# Patient Record
Sex: Female | Born: 1980 | Race: White | Hispanic: No | Marital: Married | Smoking: Former smoker
Health system: Southern US, Community
[De-identification: ages and names within clinical notes are randomized; demographics above are authoritative.]

## PROBLEM LIST (undated history)

## (undated) ENCOUNTER — Inpatient Hospital Stay: Admission: EM | Payer: Self-pay | Source: Home / Self Care

## (undated) DIAGNOSIS — G4700 Insomnia, unspecified: Secondary | ICD-10-CM

## (undated) DIAGNOSIS — F419 Anxiety disorder, unspecified: Secondary | ICD-10-CM

## (undated) DIAGNOSIS — R5383 Other fatigue: Secondary | ICD-10-CM

## (undated) DIAGNOSIS — R918 Other nonspecific abnormal finding of lung field: Secondary | ICD-10-CM

## (undated) DIAGNOSIS — B001 Herpesviral vesicular dermatitis: Secondary | ICD-10-CM

## (undated) DIAGNOSIS — E039 Hypothyroidism, unspecified: Secondary | ICD-10-CM

## (undated) DIAGNOSIS — F32A Depression, unspecified: Secondary | ICD-10-CM

## (undated) HISTORY — DX: Herpesviral vesicular dermatitis: B00.1

## (undated) HISTORY — DX: Anxiety disorder, unspecified: F41.9

## (undated) HISTORY — DX: Depression, unspecified: F32.A

## (undated) HISTORY — DX: Hypothyroidism, unspecified: E03.9

## (undated) HISTORY — PX: OTHER SURGICAL HISTORY: SHX169

## (undated) HISTORY — PX: CYSTECTOMY: SUR359

## (undated) HISTORY — DX: Other fatigue: R53.83

## (undated) HISTORY — PX: TUBAL LIGATION: SHX77

## (undated) HISTORY — PX: THYROIDECTOMY: SHX17

## (undated) HISTORY — DX: Other nonspecific abnormal finding of lung field: R91.8

## (undated) HISTORY — DX: Insomnia, unspecified: G47.00

## (undated) HISTORY — PX: TONSILLECTOMY: SUR1361

## (undated) HISTORY — PX: CHOLECYSTECTOMY: SHX55

---

## 2009-12-22 ENCOUNTER — Ambulatory Visit (HOSPITAL_COMMUNITY)
Admission: RE | Admit: 2009-12-22 | Discharge: 2009-12-22 | Payer: Self-pay | Source: Home / Self Care | Attending: Interventional Radiology | Admitting: Interventional Radiology

## 2010-03-20 LAB — POCT I-STAT, CHEM 8
BUN: 9 mg/dL (ref 6–23)
Chloride: 108 mEq/L (ref 96–112)
Creatinine, Ser: 0.7 mg/dL (ref 0.4–1.2)
Glucose, Bld: 94 mg/dL (ref 70–99)
Potassium: 3.9 mEq/L (ref 3.5–5.1)

## 2010-03-20 LAB — CBC
RDW: 13.8 % (ref 11.5–15.5)
WBC: 6.5 10*3/uL (ref 4.0–10.5)

## 2010-03-20 LAB — APTT: aPTT: 30 seconds (ref 24–37)

## 2010-03-20 LAB — PROTIME-INR: INR: 0.93 (ref 0.00–1.49)

## 2015-06-03 DIAGNOSIS — E039 Hypothyroidism, unspecified: Secondary | ICD-10-CM

## 2015-06-03 HISTORY — DX: Hypothyroidism, unspecified: E03.9

## 2015-07-21 DIAGNOSIS — E559 Vitamin D deficiency, unspecified: Secondary | ICD-10-CM

## 2015-07-21 DIAGNOSIS — C73 Malignant neoplasm of thyroid gland: Secondary | ICD-10-CM | POA: Insufficient documentation

## 2015-07-21 HISTORY — DX: Vitamin D deficiency, unspecified: E55.9

## 2015-07-21 HISTORY — DX: Malignant neoplasm of thyroid gland: C73

## 2017-05-22 DIAGNOSIS — N912 Amenorrhea, unspecified: Secondary | ICD-10-CM

## 2017-05-22 HISTORY — DX: Amenorrhea, unspecified: N91.2

## 2019-12-21 DIAGNOSIS — N83201 Unspecified ovarian cyst, right side: Secondary | ICD-10-CM

## 2019-12-21 DIAGNOSIS — R918 Other nonspecific abnormal finding of lung field: Secondary | ICD-10-CM

## 2019-12-21 HISTORY — DX: Unspecified ovarian cyst, right side: N83.201

## 2020-06-28 DIAGNOSIS — I2510 Atherosclerotic heart disease of native coronary artery without angina pectoris: Secondary | ICD-10-CM

## 2020-07-18 ENCOUNTER — Encounter: Payer: Self-pay | Admitting: *Deleted

## 2020-07-18 ENCOUNTER — Encounter: Payer: Self-pay | Admitting: Cardiology

## 2020-07-19 ENCOUNTER — Other Ambulatory Visit: Payer: Self-pay

## 2020-07-19 ENCOUNTER — Ambulatory Visit: Payer: Medicaid Other | Admitting: Cardiology

## 2020-07-19 ENCOUNTER — Encounter: Payer: Self-pay | Admitting: Cardiology

## 2020-07-19 VITALS — BP 100/70 | HR 94 | Ht 65.0 in | Wt 140.0 lb

## 2020-07-19 DIAGNOSIS — Z87891 Personal history of nicotine dependence: Secondary | ICD-10-CM

## 2020-07-19 DIAGNOSIS — I2510 Atherosclerotic heart disease of native coronary artery without angina pectoris: Secondary | ICD-10-CM | POA: Diagnosis not present

## 2020-07-19 DIAGNOSIS — E785 Hyperlipidemia, unspecified: Secondary | ICD-10-CM | POA: Diagnosis not present

## 2020-07-19 DIAGNOSIS — R079 Chest pain, unspecified: Secondary | ICD-10-CM | POA: Diagnosis not present

## 2020-07-19 MED ORDER — ROSUVASTATIN CALCIUM 10 MG PO TABS: 10.0000 mg | ORAL_TABLET | Freq: Every day | ORAL | 3 refills | Status: DC

## 2020-07-19 MED ORDER — NITROGLYCERIN 0.4 MG SL SUBL: 0.4000 mg | SUBLINGUAL_TABLET | SUBLINGUAL | 3 refills | Status: AC | PRN

## 2020-07-19 MED ORDER — IVABRADINE HCL 5 MG PO TABS: ORAL_TABLET | ORAL | 0 refills | Status: DC

## 2020-07-19 MED ORDER — ASPIRIN EC 81 MG PO TBEC: 81.0000 mg | DELAYED_RELEASE_TABLET | Freq: Every day | ORAL | 3 refills | Status: AC

## 2020-07-19 NOTE — Patient Instructions (Signed)
Medication Instructions:  Your physician has recommended you make the following change in your medication:  START: Aspirin 81 mg once daily  START: Crestor 10 mg once daily START: Nitroglycerin 0.4 mg take one tablet by mouth every 5 minutes up to three times as needed for chest pain.   *If you need a refill on your cardiac medications before your next appointment, please call your pharmacy*   Lab Work: Your physician recommends that you return for lab work in:  In 6 weeks: Lipids - come fasting  If you have labs (blood work) drawn today and your tests are completely normal, you will receive your results only by: [place] (if you have MyChart) OR A paper copy in the mail If you have any lab test that is abnormal or we need to change your treatment, we will call you to review the results.   Testing/Procedures:   Your cardiac CT will be scheduled at one of the below locations:   [facility] [address] [place], [place] [address] [phone]   If scheduled at [facility], please arrive at the [facility] main entrance (entrance A) of [facility] 30 minutes prior to test start time. Proceed to the [facility] Radiology Department (first floor) to check-in and test prep.   Please follow these instructions carefully (unless otherwise directed):  On the Night Before the Test: Be sure to Drink plenty of water. Do not consume any caffeinated/decaffeinated beverages or chocolate 12 hours prior to your test. Do not take any antihistamines 12 hours prior to your test.   On the Day of the Test: Drink plenty of water until 1 hour prior to the test. Do not eat any food 4 hours prior to the test. You may take your regular medications prior to the test.  Take Ivabradine (Corlanor) two hours prior to test. FEMALES- please wear underwire-free bra if available, avoid dresses & tight clothing      After the Test: Drink plenty of water. After  receiving IV contrast, you may experience a mild flushed feeling. This is normal. On occasion, you may experience a mild rash up to 24 hours after the test. This is not dangerous. If this occurs, you can take Benadryl 25 mg and increase your fluid intake. If you experience trouble breathing, this can be serious. If it is severe call 911 IMMEDIATELY. If it is mild, please call our office. If you take any of these medications: Glipizide/Metformin, Avandament, Glucavance, please do not take 48 hours after completing test unless otherwise instructed.   Once we have confirmed authorization from your insurance company, we will call you to set up a date and time for your test. Based on how quickly your insurance processes prior authorizations requests, please allow up to 4 weeks to be contacted for scheduling your Cardiac CT appointment. Be advised that routine Cardiac CT appointments could be scheduled as many as 8 weeks after your provider has ordered it.  For non-scheduling related questions, please contact the cardiac imaging nurse navigator should you have any questions/concerns: Marchia Bond, Cardiac Imaging Nurse Navigator Gordy Clement, Cardiac Imaging Nurse Navigator [facility] Direct Office Dial: [phone]   For scheduling needs, including cancellations and rescheduling, please call [place], [phone].    Follow-Up: At [facility], you and your health needs are our priority.  As part of our continuing mission to provide you with exceptional heart care, we have created designated Provider Care Teams.  These Care Teams include your primary Cardiologist (physician) and Advanced Practice Providers (APPs -  Physician Assistants and Nurse Practitioners) who all work together to provide you with the care you need, when you need it.  We recommend signing up for the patient portal called "MyChart".  Sign up information is provided on this After Visit Summary.   MyChart is used to connect with patients for Virtual Visits (Telemedicine).  Patients are able to view lab/test results, encounter notes, upcoming appointments, etc.  Non-urgent messages can be sent to your provider as well.   To learn more about what you can do with MyChart, go to [email or web address].    Your next appointment:   12 week(s)  The format for your next appointment:   In Person  Provider:   [address] - Berniece Salines, DO    Other Instructions

## 2020-07-19 NOTE — Progress Notes (Signed)
Cardiology Office Note:    Date:  07/19/2020   ID:  Taiyana Kissler, DOB 04/28/1980, MRN [id number]  PCP:  Zoila Shutter, NP  Cardiologist:  Berniece Salines, DO  Electrophysiologist:  None   Referring MD: Gardiner Rhyme, MD   Chief Complaint  Patient presents with   Shortness of Breath   Chest Pain    History of Present Illness:    Amber Velasquez is a 40 y.o. female with a hx of former smoker quit 7 months ago, hypothyroidism, depression is here today after she was referred by her pulmonary doctor.  The patient tells me that she had had some shortness of breath as well as intermittent chest pain and was worked up by her pulmonary doctor with a CT scan of the chest which at the time showed pulmonary nodules as well as she tells me that he reported to her that they were concerned for coronary calcification.  He said at that time they both decided to do her pulmonary work-up before pursuing cardiac work-up. Recently she tells me she has been experiencing significant chest discomfort.  She described as a midsternal chest pain which she describes comes on sometimes it feels burning symptom squeezing and sometimes as a pressure-like sensation.  Symptoms last about 30 minutes and 1 time up to an hour.  Nothing makes it better or worse.  Is intermittent.  There is no radiation of this pain.  She admits to associated shortness of breath.   Past Medical History:  Diagnosis Date   Amenorrhea 05/22/2017   Anxiety    Cyst of right ovary 12/21/2019   Depression    Hypothyroid 06/03/2015   Hypothyroidism    Thyroid cancer ([place]) 07/21/2015   Vitamin D deficiency 07/21/2015    Past Surgical History:  Procedure Laterality Date   CHOLECYSTECTOMY     CYSTECTOMY     THYROIDECTOMY     TONSILLECTOMY     TUBAL LIGATION      Current Medications: Current Meds  Medication Sig   ALPRAZolam (XANAX) 0.5 MG tablet Take 0.5 mg by mouth at bedtime as needed for anxiety.   aspirin EC 81 MG tablet Take 1  tablet (81 mg total) by mouth daily. Swallow whole.   ivabradine (CORLANOR) 5 MG TABS tablet Take 10 mg (two tablets) 2 hours before CT scan.   levothyroxine (SYNTHROID) 200 MCG tablet Take 200 mcg by mouth daily before breakfast.   lisdexamfetamine (VYVANSE) 60 MG capsule Take 60 mg by mouth every morning.   nitroGLYCERIN (NITROSTAT) 0.4 MG SL tablet Place 1 tablet (0.4 mg total) under the tongue every 5 (five) minutes as needed for chest pain.   rosuvastatin (CRESTOR) 10 MG tablet Take 1 tablet (10 mg total) by mouth daily.   vortioxetine HBr (TRINTELLIX) 20 MG TABS tablet Take 20 mg by mouth daily.     Allergies:   Patient has no known allergies.   Social History   Socioeconomic History   Marital status: Married    Spouse name: Not on file   Number of children: Not on file   Years of education: Not on file   Highest education level: Not on file  Occupational History   Not on file  Tobacco Use   Smoking status: Former    Pack years: 0.00    Types: Cigarettes    Quit date: 12/2019    Years since quitting: 0.6   Smokeless tobacco: Never  Substance and Sexual Activity   Alcohol use: Never  Drug use: Never   Sexual activity: Not on file  Other Topics Concern   Not on file  Social History Narrative   Not on file   Social Determinants of Health   Financial Resource Strain: Not on file  Food Insecurity: Not on file  Transportation Needs: Not on file  Physical Activity: Not on file  Stress: Not on file  Social Connections: Not on file     Family History: The patient's family history includes Breast cancer in her maternal grandmother and mother; COPD in her mother; Diabetes in her mother; Heart Problems in her paternal grandfather; Heart disease in her sister; Hypertension in her father and mother; Lung cancer in her maternal grandfather and mother.  ROS:   Review of Systems  Constitution: Negative for decreased appetite, fever and weight gain.  HENT: Negative for  congestion, ear discharge, hoarse voice and sore throat.   Eyes: Negative for discharge, redness, vision loss in right eye and visual halos.  Cardiovascular: Reports chest pain, dyspnea on exertion,.  Negative for leg swelling, orthopnea and palpitations.  Respiratory: Negative for cough, hemoptysis, shortness of breath and snoring.   Endocrine: Negative for heat intolerance and polyphagia.  Hematologic/Lymphatic: Negative for bleeding problem. Does not bruise/bleed easily.  Skin: Negative for flushing, nail changes, rash and suspicious lesions.  Musculoskeletal: Negative for arthritis, joint pain, muscle cramps, myalgias, neck pain and stiffness.  Gastrointestinal: Negative for abdominal pain, bowel incontinence, diarrhea and excessive appetite.  Genitourinary: Negative for decreased libido, genital sores and incomplete emptying.  Neurological: Negative for brief paralysis, focal weakness, headaches and loss of balance.  Psychiatric/Behavioral: Negative for altered mental status, depression and suicidal ideas.  Allergic/Immunologic: Negative for HIV exposure and persistent infections.    EKGs/Labs/Other Studies Reviewed:    The following studies were reviewed today:   EKG:  The ekg ordered today demonstrates sinus rhythm, heart rate 94 bpm  Echocardiogram done at [facility] on June 28, 2020 showed normal EF 60 to 65%.  Normal diastolic function.  Right ventricle is normal in size and function.  Left atrium normal in size.  Right atrium is normal in size and function.  There is mild aortic valve sclerosis.  Normal-appearing mitral valve.  Tricuspid regurgitation was present.  The pulmonic valve is normal.  The aortic root, ascending aorta and aortic arch are appear to be normal.  There is no pericardial fusion.  Recent Labs: No results found for requested labs within last 8760 hours.  Recent Lipid Panel No results found for: CHOL, TRIG, HDL, CHOLHDL, VLDL, LDLCALC,  LDLDIRECT  Physical Exam:    VS:  BP 100/70 (BP Location: Left Arm, Patient Position: Sitting, Cuff Size: Normal)   Pulse 94   Ht 5\' 5"  (1.651 m)   Wt 140 lb (63.5 kg)   SpO2 99%   BMI 23.30 kg/m     Wt Readings from Last 3 Encounters:  07/19/20 140 lb (63.5 kg)  07/04/20 139 lb (63 kg)     GEN: Well nourished, well developed in no acute distress HEENT: Normal NECK: No JVD; No carotid bruits LYMPHATICS: No lymphadenopathy CARDIAC: S1S2 noted,RRR, no murmurs, rubs, gallops RESPIRATORY:  Clear to auscultation without rales, wheezing or rhonchi  ABDOMEN: Soft, non-tender, non-distended, +bowel sounds, no guarding. EXTREMITIES: No edema, No cyanosis, no clubbing MUSCULOSKELETAL:  No deformity  SKIN: Warm and dry NEUROLOGIC:  Alert and oriented x 3, non-focal PSYCHIATRIC:  Normal affect, good insight  ASSESSMENT:    1. Chest pain, unspecified type  2. Former smoker   3. Coronary artery calcification seen on CT scan   4. Hyperlipidemia, unspecified hyperlipidemia type    PLAN:      The symptoms chest pain is concerning, this patient does have intermediate risk for coronary artery disease and at this time I would like to pursue an ischemic evaluation in this patient.  Shared decision a coronary CTA at this time is appropriate.  I have discussed with the patient about the testing.  The patient has no IV contrast allergy and is agreeable to proceed with this test.  Sublingual nitroglycerin prescription was sent, its protocol and 911 protocol explained and the patient vocalized understanding questions were answered to the patient's satisfaction  Smoking cessation advised.   The patient is in agreement with the above plan. The patient left the office in stable condition.  The patient will follow up in 12 weeks.   Medication Adjustments/Labs and Tests Ordered: Current medicines are reviewed at length with the patient today.  Concerns regarding medicines are outlined above.   Orders Placed This Encounter  Procedures   CT CORONARY MORPH W/CTA COR W/SCORE W/CA W/CM &/OR WO/CM   Lipid panel   Basic Metabolic Panel (BMET)   Magnesium   EKG 12-Lead   Meds ordered this encounter  Medications   aspirin EC 81 MG tablet    Sig: Take 1 tablet (81 mg total) by mouth daily. Swallow whole.    Dispense:  90 tablet    Refill:  3   rosuvastatin (CRESTOR) 10 MG tablet    Sig: Take 1 tablet (10 mg total) by mouth daily.    Dispense:  90 tablet    Refill:  3   ivabradine (CORLANOR) 5 MG TABS tablet    Sig: Take 10 mg (two tablets) 2 hours before CT scan.    Dispense:  2 tablet    Refill:  0   nitroGLYCERIN (NITROSTAT) 0.4 MG SL tablet    Sig: Place 1 tablet (0.4 mg total) under the tongue every 5 (five) minutes as needed for chest pain.    Dispense:  45 tablet    Refill:  3    Patient Instructions  Medication Instructions:  Your physician has recommended you make the following change in your medication:  START: Aspirin 81 mg once daily  START: Crestor 10 mg once daily START: Nitroglycerin 0.4 mg take one tablet by mouth every 5 minutes up to three times as needed for chest pain.   *If you need a refill on your cardiac medications before your next appointment, please call your pharmacy*   Lab Work: Your physician recommends that you return for lab work in:  In 6 weeks: Lipids - come fasting  If you have labs (blood work) drawn today and your tests are completely normal, you will receive your results only by: [place] (if you have MyChart) OR A paper copy in the mail If you have any lab test that is abnormal or we need to change your treatment, we will call you to review the results.   Testing/Procedures:   Your cardiac CT will be scheduled at one of the below locations:   [facility] [address] [place], [place] [address] [phone]   If scheduled at [facility], please arrive at the [facility] main entrance  (entrance A) of [facility] 30 minutes prior to test start time. Proceed to the [facility] Radiology Department (first floor) to check-in and test prep.  Please follow these instructions carefully (unless otherwise directed):  On the Night Before the Test: Be sure to Drink plenty of water. Do not consume any caffeinated/decaffeinated beverages or chocolate 12 hours prior to your test. Do not take any antihistamines 12 hours prior to your test.   On the Day of the Test: Drink plenty of water until 1 hour prior to the test. Do not eat any food 4 hours prior to the test. You may take your regular medications prior to the test.  Take Ivabradine (Corlanor) two hours prior to test. FEMALES- please wear underwire-free bra if available, avoid dresses & tight clothing      After the Test: Drink plenty of water. After receiving IV contrast, you may experience a mild flushed feeling. This is normal. On occasion, you may experience a mild rash up to 24 hours after the test. This is not dangerous. If this occurs, you can take Benadryl 25 mg and increase your fluid intake. If you experience trouble breathing, this can be serious. If it is severe call 911 IMMEDIATELY. If it is mild, please call our office. If you take any of these medications: Glipizide/Metformin, Avandament, Glucavance, please do not take 48 hours after completing test unless otherwise instructed.   Once we have confirmed authorization from your insurance company, we will call you to set up a date and time for your test. Based on how quickly your insurance processes prior authorizations requests, please allow up to 4 weeks to be contacted for scheduling your Cardiac CT appointment. Be advised that routine Cardiac CT appointments could be scheduled as many as 8 weeks after your provider has ordered it.  For non-scheduling related questions, please contact the cardiac imaging nurse navigator should you have any  questions/concerns: Marchia Bond, Cardiac Imaging Nurse Navigator Gordy Clement, Cardiac Imaging Nurse Navigator [place] [facility] Direct Office Dial: [phone]   For scheduling needs, including cancellations and rescheduling, please call [place], [phone].    Follow-Up: At [facility], you and your health needs are our priority.  As part of our continuing mission to provide you with exceptional heart care, we have created designated Provider Care Teams.  These Care Teams include your primary Cardiologist (physician) and Advanced Practice Providers (APPs -  Physician Assistants and Nurse Practitioners) who all work together to provide you with the care you need, when you need it.  We recommend signing up for the patient portal called "MyChart".  Sign up information is provided on this After Visit Summary.  MyChart is used to connect with patients for Virtual Visits (Telemedicine).  Patients are able to view lab/test results, encounter notes, upcoming appointments, etc.  Non-urgent messages can be sent to your provider as well.   To learn more about what you can do with MyChart, go to [email or web address].    Your next appointment:   12 week(s)  The format for your next appointment:   In Person  Provider:   [address] - Berniece Salines, DO    Other Instructions    Adopting a Healthy Lifestyle.  Know what a healthy weight is for you (roughly BMI <25) and aim to maintain this   Aim for 7+ servings of fruits and vegetables daily   65-80+ fluid ounces of water or unsweet tea for healthy kidneys   Limit to max 1 drink of alcohol per day; avoid smoking/tobacco   Limit animal fats in diet for cholesterol and heart health - choose grass fed whenever available  Avoid highly processed foods, and foods high in saturated/trans fats   Aim for low stress - take time to unwind and care for your mental health   Aim for 150 min of moderate intensity  exercise weekly for heart health, and weights twice weekly for bone health   Aim for 7-9 hours of sleep daily   When it comes to diets, agreement about the perfect plan isnt easy to find, even among the experts. Experts at the [place] [employer] developed an idea known as the Healthy Eating Plate. Just imagine a plate divided into logical, healthy portions.   The emphasis is on diet quality:   Load up on vegetables and fruits - one-half of your plate: Aim for color and variety, and remember that potatoes dont count.   Go for whole grains - one-quarter of your plate: Whole wheat, barley, wheat berries, quinoa, oats, brown rice, and foods made with them. If you want pasta, go with whole wheat pasta.   Protein power - one-quarter of your plate: Fish, chicken, beans, and nuts are all healthy, versatile protein sources. Limit red meat.   The diet, however, does go beyond the plate, offering a few other suggestions.   Use healthy plant oils, such as olive, canola, soy, corn, sunflower and peanut. Check the labels, and avoid partially hydrogenated oil, which have unhealthy trans fats.   If youre thirsty, drink water. Coffee and tea are good in moderation, but skip sugary drinks and limit milk and dairy products to one or two daily servings.   The type of carbohydrate in the diet is more important than the amount. Some sources of carbohydrates, such as vegetables, fruits, whole grains, and beans-are healthier than others.   Finally, stay active  Signed, Berniece Salines, DO  07/19/2020 4:23 PM    [facility]

## 2020-07-23 LAB — BASIC METABOLIC PANEL
BUN/Creatinine Ratio: 17 (ref 9–23)
BUN: 11 mg/dL (ref 6–20)
CO2: 26 mmol/L (ref 20–29)
Calcium: 9.1 mg/dL (ref 8.7–10.2)
Chloride: 102 mmol/L (ref 96–106)
Creatinine, Ser: 0.66 mg/dL (ref 0.57–1.00)
Glucose: 87 mg/dL (ref 65–99)
Potassium: 4.3 mmol/L (ref 3.5–5.2)
Sodium: 140 mmol/L (ref 134–144)
eGFR: 114 mL/min/{1.73_m2} (ref >59)

## 2020-07-23 LAB — LIPID PANEL
Chol/HDL Ratio: 2.9 ratio (ref 0.0–4.4)
Cholesterol, Total: 192 mg/dL (ref 100–199)
HDL: 67 mg/dL (ref >39)
LDL Chol Calc (NIH): 114 mg/dL — ABNORMAL HIGH (ref 0–99)
Triglycerides: 60 mg/dL (ref 0–149)
VLDL Cholesterol Cal: 11 mg/dL (ref 5–40)

## 2020-07-23 LAB — MAGNESIUM: Magnesium: 1.9 mg/dL (ref 1.6–2.3)

## 2020-07-28 ENCOUNTER — Telehealth (HOSPITAL_COMMUNITY): Payer: Self-pay | Admitting: Emergency Medicine

## 2020-07-28 NOTE — Telephone Encounter (Signed)
Calling to review CCTA instructions.  Pt states appt was moved to next week since [place] is still pending auth.  Marchia Bond RN Navigator Cardiac Imaging [facility] [phone] Office  [phone] Cell

## 2020-07-29 ENCOUNTER — Ambulatory Visit (HOSPITAL_COMMUNITY): Admission: RE | Admit: 2020-07-29 | Payer: BC Managed Care – PPO | Source: Ambulatory Visit

## 2020-08-03 ENCOUNTER — Telehealth (HOSPITAL_COMMUNITY): Payer: Self-pay | Admitting: Emergency Medicine

## 2020-08-03 NOTE — Telephone Encounter (Signed)
Attempted to call patient regarding upcoming cardiac CT appointment. °Left message on voicemail with name and callback number °Markevious Ehmke RN Navigator Cardiac Imaging °[place] [facility] °[phone] Office °[phone] Cell ° °

## 2020-08-05 ENCOUNTER — Other Ambulatory Visit: Payer: Self-pay

## 2020-08-05 ENCOUNTER — Encounter (HOSPITAL_COMMUNITY): Payer: Self-pay

## 2020-08-05 ENCOUNTER — Ambulatory Visit (HOSPITAL_COMMUNITY)
Admission: RE | Admit: 2020-08-05 | Discharge: 2020-08-05 | Disposition: A | Discharge status: Home / Self Care | Payer: BC Managed Care – PPO | Source: Ambulatory Visit | Attending: Cardiology | Admitting: Cardiology

## 2020-08-05 DIAGNOSIS — R079 Chest pain, unspecified: Secondary | ICD-10-CM | POA: Diagnosis present

## 2020-08-05 MED ORDER — NITROGLYCERIN 0.4 MG SL SUBL
SUBLINGUAL_TABLET | SUBLINGUAL | Status: AC
  Filled 2020-08-05: qty 2

## 2020-08-05 MED ORDER — IOHEXOL 350 MG/ML SOLN
100.0000 mL | Freq: Once | INTRAVENOUS | Status: AC | PRN
  Administered 2020-08-05: 100 mL via INTRAVENOUS

## 2020-08-05 MED ORDER — NITROGLYCERIN 0.4 MG SL SUBL
0.8000 mg | SUBLINGUAL_TABLET | Freq: Once | SUBLINGUAL | Status: AC
  Administered 2020-08-05: 0.8 mg via SUBLINGUAL

## 2020-08-05 MED ORDER — METOPROLOL TARTRATE 5 MG/5ML IV SOLN
INTRAVENOUS | Status: AC
  Filled 2020-08-05: qty 10

## 2020-08-08 ENCOUNTER — Telehealth: Payer: Self-pay | Admitting: Cardiology

## 2020-08-08 NOTE — Telephone Encounter (Signed)
Spoke to the patient just now and we went over her CT results again together. She verbalizes understanding and thanks me for calling her back.

## 2020-08-08 NOTE — Telephone Encounter (Signed)
Patient wanted to know if Dr. Harriet Masson had a chance to go over the results from the CT she had done 08/05/20.   Please advise

## 2020-08-10 NOTE — Telephone Encounter (Signed)
Mirella is calling requesting to speak with Hilda Blades per her request in these message. Best callback number is [phone].

## 2020-08-12 ENCOUNTER — Telehealth: Payer: Self-pay | Admitting: Cardiology

## 2020-08-12 NOTE — Telephone Encounter (Signed)
Pt is returning call from 08/11/20. Please advise pt further

## 2020-08-15 ENCOUNTER — Ambulatory Visit: Payer: Self-pay | Admitting: Cardiology

## 2020-08-16 NOTE — Telephone Encounter (Signed)
Left message for patient to return the call.

## 2020-09-20 ENCOUNTER — Other Ambulatory Visit: Payer: Self-pay | Admitting: Obstetrics and Gynecology

## 2020-09-20 DIAGNOSIS — R921 Mammographic calcification found on diagnostic imaging of breast: Secondary | ICD-10-CM

## 2020-10-14 ENCOUNTER — Ambulatory Visit
Admission: RE | Admit: 2020-10-14 | Discharge: 2020-10-14 | Disposition: A | Discharge status: Home / Self Care | Payer: BC Managed Care – PPO | Source: Ambulatory Visit | Attending: Obstetrics and Gynecology | Admitting: Obstetrics and Gynecology

## 2020-10-14 ENCOUNTER — Other Ambulatory Visit: Payer: Self-pay

## 2020-10-14 DIAGNOSIS — R921 Mammographic calcification found on diagnostic imaging of breast: Secondary | ICD-10-CM

## 2020-10-14 HISTORY — PX: BREAST BIOPSY: SHX20

## 2020-10-28 ENCOUNTER — Ambulatory Visit: Payer: BC Managed Care – PPO | Admitting: Cardiology

## 2021-06-03 ENCOUNTER — Encounter: Payer: Self-pay | Admitting: Cardiology

## 2021-06-03 DIAGNOSIS — R634 Abnormal weight loss: Secondary | ICD-10-CM

## 2022-01-23 IMAGING — CT CT HEART MORP W/ CTA COR W/ SCORE W/ CA W/CM &/OR W/O CM
4 of 7 series · 8 of 20 positions shown, 9 images · IV contrast (omnipaque)
Comparison: Chest CT 05/30/2020 and chest CT 11/27/2019
COMPARISON: Chest CT 05/30/2020 and chest CT 11/27/2019

Addendum:
EXAM:
OVER-READ INTERPRETATION  CT CHEST

The following report is an over-read performed by radiologist Dr.
Yasamin Tran [REDACTED] on 08/05/2020. This over-read
does not include interpretation of cardiac or coronary anatomy or
pathology. The coronary calcium score/coronary CTA interpretation by
the cardiologist is attached.
HISTORY: 40 yo female with non-specific chest pain
Cardiac/Coronary CTA
TECHNIQUE: The patient was scanned on a Siemens Force scanner.
PROTOCOL: A 100 kV prospective scan was triggered in the descending thoracic
aorta at 111 HU's. Axial non-contrast 3 mm slices were carried out
through the heart. The data set was analyzed on a dedicated work
station and scored using the Agatson method. Gantry rotation speed
was 250 msecs and collimation was .6 mm. Beta blockade and 0.8 mg of
sl NTG was given. The 3D data set was reconstructed in 5% intervals
of the 35-75 % of the R-R cycle. Diastolic phases were analyzed on a
dedicated work station using MPR, MIP and VRT modes. The patient
received 100mL OMNIPAQUE IOHEXOL 350 MG/ML SOLN of contrast.

[Series 6: best diast 76 % · axial · 0.35mm/px · z∈[+1098,+1141]mm · 2 of 328 slices shown]
[im 110/328  vessel]
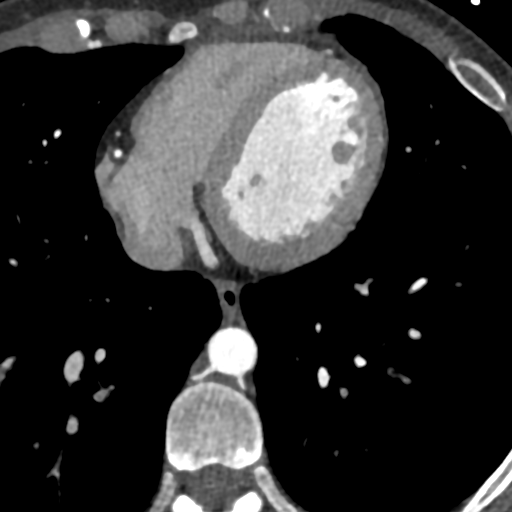
[im 219/328  vessel]
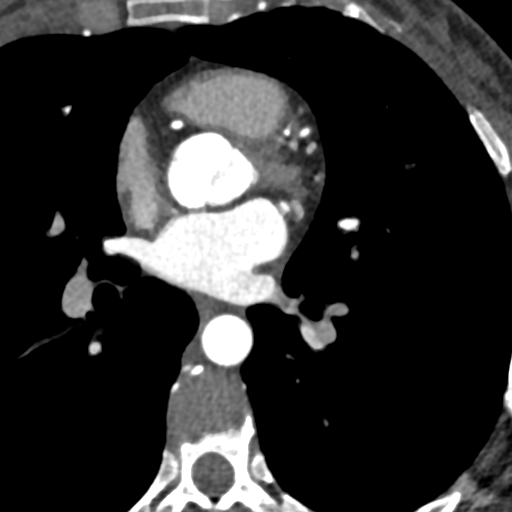

[Series 7: best syst · axial · 0.35mm/px · z∈[+1098,+1141]mm · 2 of 328 slices shown, 3 images]
[im 110/328  vessel]
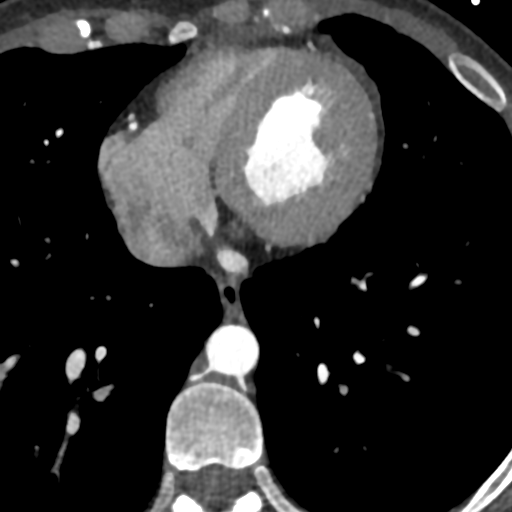
[im 110/328  lung]
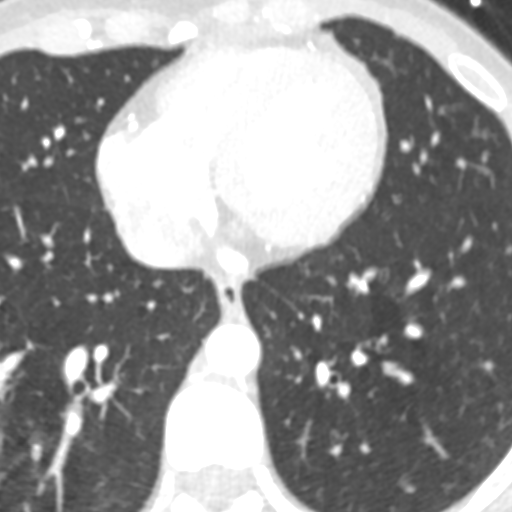
[im 219/328  vessel]
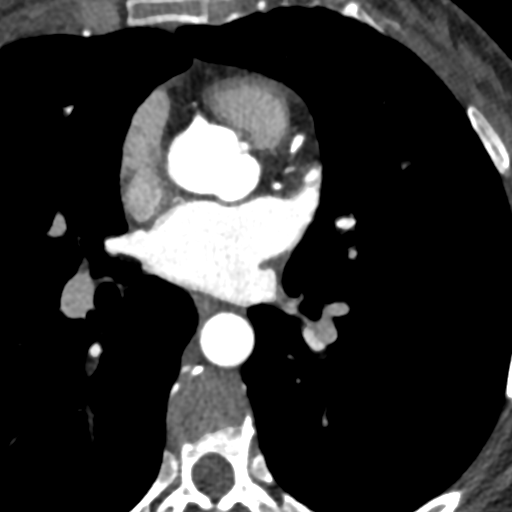

[Series 8: ts diast sharp 76 % · axial · 0.35mm/px · z∈[+1098,+1141]mm · 2 of 328 slices shown]
[im 110/328  lung]
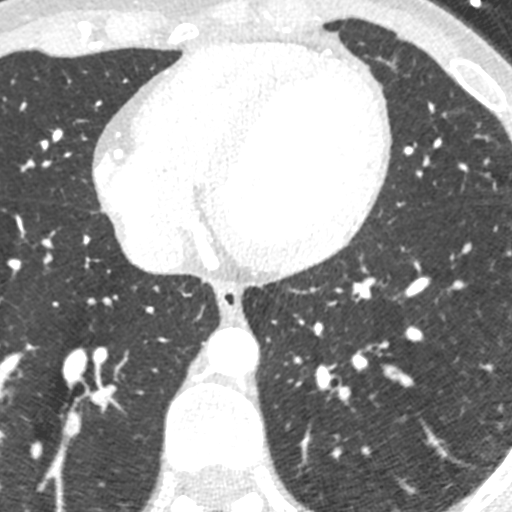
[im 219/328  lung]
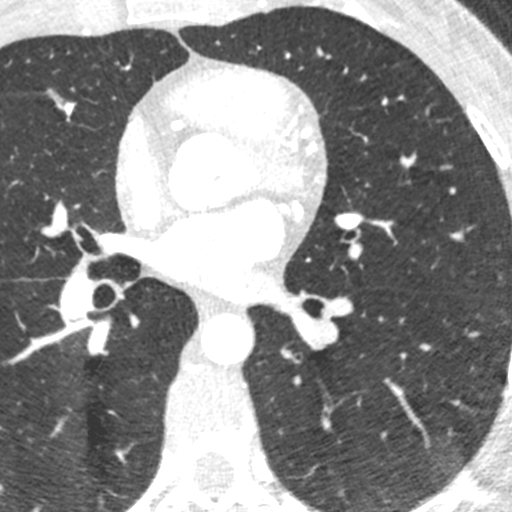

[Series 9: ts syst sharp · axial · 0.35mm/px · z∈[+1098,+1141]mm · 2 of 328 slices shown]
[im 110/328  lung]
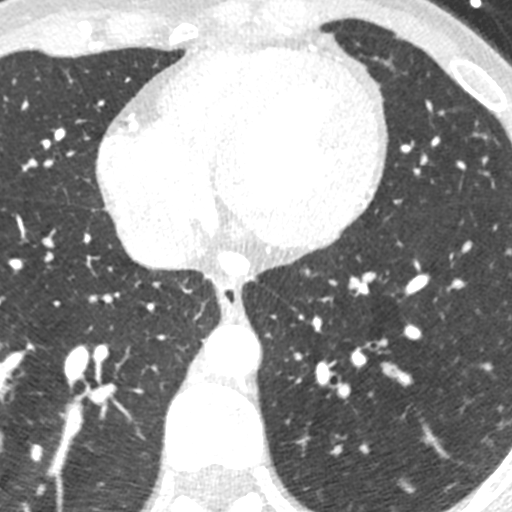
[im 219/328  lung]
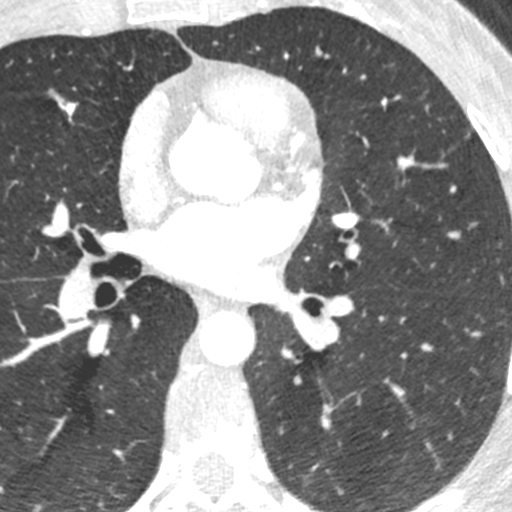

[8 of 20 positions shown; findings below may reference images not displayed]

FINDINGS: Vascular: Normal caliber of the visualized thoracic aorta. Main
pulmonary arteries are patent.

Mediastinum/Nodes: Visualized mediastinal structures are normal.

Lungs/Pleura: Again noted is a peripheral nodule in the posterior
right lower lobe on sequence 11, image 11 with a mean diameter of 5
mm. This nodule has not significantly changed. Stable punctate
peripheral nodule in the right lower lobe on sequence 11, image 41.
Stable punctate peripheral nodule in the right lower lobe on image
49. Stable pleural-based nodule in the right lower lobe measuring 3
mm on image 38. Stable peripheral nodule in left lower lobe on image
46. The patchy inflammatory changes in the left lower lobe have
resolved from the previous examination. Small peripheral nodule in
the left lower lobe on image 64 is probably stable. Additional tiny
nodules in left lower lobe appears stable.

Upper Abdomen: Images of the upper abdomen are unremarkable.

Musculoskeletal: No acute bone abnormalities.
IMPRESSION: 1. No acute findings involving the extracardiac structures.
2. Again noted are multiple small pulmonary nodules. Largest
measures 5 mm in mean diameter. No follow-up needed if patient is
low-risk (and has no known or suspected primary neoplasm).
Non-contrast chest CT can be considered in 12 months if patient is
high-risk. This recommendation follows the consensus statement:
Guidelines for Management of Incidental Pulmonary Nodules Detected
3. Infectious or inflammatory changes in left lower lobe have
resolved since the previous examination.
FINDINGS: Quality: Good, HR 65, motion artifact

Coronary calcium score: The patient's coronary artery calcium score
is 0, which places the patient in the 0 percentile.

Coronary arteries: Normal coronary origins.  Right dominance.

Right Coronary Artery: Dominant.  Normal vessel.

Left Main Coronary Artery: Short vessel, no disease. Bifurcates as
usual into the LAD and LCx arteries.

Left Anterior Descending Coronary Artery: Normal anterior vessel
that reaches apex. Large septal perforator. Moderate sized
mid-vessel D1 branch. No disease of the LAD or branches.

Left Circumflex Artery: AV groove vessel without disease. Small high
OM1 branch without disease.

Aorta: Normal size, 25 mm at the mid ascending aorta (level of the
PA bifurcation) measured double oblique. No calcifications. No
dissection.

Aortic Valve: Trileaflet.  No calcifications.

Other findings:

Normal pulmonary vein drainage into the left atrium.

Normal left atrial appendage without a thrombus.

Normal size of the pulmonary artery.
IMPRESSION: 1. No evidence of CAD, CADRADS = 0.

2. Coronary calcium score of 0. This was 0 percentile for age and
sex matched control.

3. Normal coronary origin with right dominance.

4. Consider non-coronary causes of chest pain.

*** End of Addendum ***
EXAM:
OVER-READ INTERPRETATION  CT CHEST

The following report is an over-read performed by radiologist Dr.
Yasamin Tran [REDACTED] on 08/05/2020. This over-read
does not include interpretation of cardiac or coronary anatomy or
pathology. The coronary calcium score/coronary CTA interpretation by
the cardiologist is attached.
FINDINGS: Vascular: Normal caliber of the visualized thoracic aorta. Main
pulmonary arteries are patent.

Mediastinum/Nodes: Visualized mediastinal structures are normal.

Lungs/Pleura: Again noted is a peripheral nodule in the posterior
right lower lobe on sequence 11, image 11 with a mean diameter of 5
mm. This nodule has not significantly changed. Stable punctate
peripheral nodule in the right lower lobe on sequence 11, image 41.
Stable punctate peripheral nodule in the right lower lobe on image
49. Stable pleural-based nodule in the right lower lobe measuring 3
mm on image 38. Stable peripheral nodule in left lower lobe on image
46. The patchy inflammatory changes in the left lower lobe have
resolved from the previous examination. Small peripheral nodule in
the left lower lobe on image 64 is probably stable. Additional tiny
nodules in left lower lobe appears stable.

Upper Abdomen: Images of the upper abdomen are unremarkable.

Musculoskeletal: No acute bone abnormalities.
IMPRESSION: 1. No acute findings involving the extracardiac structures.
2. Again noted are multiple small pulmonary nodules. Largest
measures 5 mm in mean diameter. No follow-up needed if patient is
low-risk (and has no known or suspected primary neoplasm).
Non-contrast chest CT can be considered in 12 months if patient is
high-risk. This recommendation follows the consensus statement:
Guidelines for Management of Incidental Pulmonary Nodules Detected
3. Infectious or inflammatory changes in left lower lobe have
resolved since the previous examination.

## 2022-11-09 LAB — HM MAMMOGRAPHY

## 2023-02-08 ENCOUNTER — Encounter: Payer: Self-pay | Admitting: Cardiology

## 2023-02-18 ENCOUNTER — Other Ambulatory Visit: Payer: Self-pay

## 2023-02-18 DIAGNOSIS — R252 Cramp and spasm: Secondary | ICD-10-CM

## 2023-05-07 ENCOUNTER — Encounter: Payer: Self-pay | Admitting: Hematology and Oncology

## 2023-05-07 ENCOUNTER — Other Ambulatory Visit: Payer: Self-pay | Admitting: Hematology and Oncology

## 2023-05-07 DIAGNOSIS — D72819 Decreased white blood cell count, unspecified: Secondary | ICD-10-CM

## 2023-05-07 NOTE — Progress Notes (Unsigned)
 [facility] [address] [place],  [place]  [address] [phone]  Clinic Day:  05/08/2023   Referring physician: Verdia Glad, NP  Patient Care Team: Patient Care Team: Verdia Glad, NP as PCP - General Tobb, Kardie, DO as PCP - Cardiology (Cardiology)   REASON FOR CONSULTATION:  Leukopenia  HISTORY OF PRESENT ILLNESS:  Amber Velasquez is a 43 y.o. female with mild leukopenia who is referred in consultation by Anastasia Kasal, NP for assessment and management. The patient was seen for routine follow-up of depression on April 25.  CBC revealed WBCs 3.5, 49% neutrophils 34% lymphocytes, 12% monocytes, 3% eosinophils and 1% basophils.  Hemoglobin 13.4 with an MCV 93. Platelets 307,000.  CMP was normal.  B12, folate, and iron were normal.  CBC from September 2024 revealed WBCs 3.6, 55% neutrophils, 27% lymphocytes, 13% monocytes, 1% eosinophils and 1% basophils.  WBCs were 4.6 in June 2024.  Review of her records did not reveal previous leukopenia.  The patient reports severe fatigue, nonrefreshing sleep and intermittent drenching night sweats for the past year.  She states her white blood count has been decreasing over the last several years.  She reports chronic headaches, so an MRI brain is scheduled May 8.  She has had 4 episodes of syncope in the past 6 months reportedly felt to be due to vasovagal response.  She has a history of angina. She has had a previous cardiac evaluation.  She reports nausea with intermittent vomiting, for which ondansetron is effective.  She also has had episodes of purple or white discoloration of fingers and toes with associated tingling and numbness usually lasting only minutes. It does not seem to be related to exposure to cold.  She reports irregular menses, occasional heavy with clots x 2 days.  Last menses was in January.  She has had new onset acne in the last year.  She states testing shows her to be perimenopausal.  She has a  history of thyroid cancer at age 64.  She is overdue for annual follow up with endocrinology.  She has a history of lung nodules found incidentally on CT abdomen and pelvis.  CT abdomen and pelvis in November 2021 revealed hepatic steatosis bronchoscopy in December 2021 did not reveal any abnormality.  She states she is followed by Dr. Corita Diego.  CT chest in February 2023 was stable, but follow-up in 1 year was recommended.  She states Dr. Olegario Berlin office was unable to get this with due to insurance.  Past medical history: Hypothyroidism, angina, vitamin D deficiency, depression/anxiety, acne.  Status post thyroidectomy, benign left breast biopsy, left ovarian cystectomy of benign serous cyst/hemorrhagic corpus luteum cyst, tubal ligation, tonsillectomy, cholecystectomy, gonadal vein embolization with coil placement, bilateral leg vein cauterization. G5 P4, 1 miscarriage.  She had an EGD in May 2013 which was negative.  She is up-to-date on mammogram. She is not up to date on pelvic exam/Pap smear.  Social history: She is a former smoker, quit 3 years ago. Smoking included 1.5 pack of cigarettes a day for 20 years.  She reports occasional alcohol use.  She other substance use.  She is married with 4 children, 2 at home. She is in school for accounting/finance. 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  Family history: A sister has anemia requiring transfusions, as well as newly diagnosed malignant spinal tumor of unknown type at age 42 and new DVT/PE post op.  Apparently, she has abnormal genetics associated with  an increased risk of colon cancer.  Her mother had breast cancer at age 21, later lung and spine. A sister had breast cancer at age 36.  Her maternal grandmother had unknown cancer at unknown age.  Her maternal grandfather had unknown cancer at unknown age.   REVIEW OF SYSTEMS:  Review of Systems  Constitutional:  Positive for appetite change (mildly decreased) and diaphoresis (occasional,  drenching, 3-4 x wk). Negative for chills, fatigue, fever and unexpected weight change.  HENT:   Positive for nosebleeds (1 episode last week lasting 15 mins). Negative for lump/mass, mouth sores, sore throat and trouble swallowing.   Respiratory:  Negative for cough, hemoptysis and shortness of breath.   Cardiovascular:  Negative for chest pain and leg swelling.  Gastrointestinal:  Positive for nausea and vomiting. Negative for abdominal pain, blood in stool, constipation and diarrhea.  Endocrine: Positive for hot flashes.  Genitourinary:  Positive for menstrual problem. Negative for difficulty urinating, dysuria, frequency and hematuria.   Musculoskeletal:  Positive for arthralgias (bilateral hips) and back pain (lower back). Negative for gait problem, myalgias and neck pain.  Skin:  Negative for itching, rash and wound.  Neurological:  Positive for dizziness (intermittent, occasional syncope) and numbness (intermittent finger & toes). Negative for extremity weakness, gait problem, headaches and seizures.  Hematological:  Negative for adenopathy. Does not bruise/bleed easily.  Psychiatric/Behavioral:  Positive for depression. Negative for sleep disturbance. The patient is nervous/anxious.      VITALS:  Blood pressure 123/75, pulse 100, temperature 97.9 F (36.6 C), temperature source Oral, resp. rate 18, height 5\' 4"  (1.626 m), weight 148 lb 9.6 oz (67.4 kg), last menstrual period 01/09/2023, SpO2 100%.  Wt Readings from Last 3 Encounters:  05/08/23 148 lb 9.6 oz (67.4 kg)  07/19/20 140 lb (63.5 kg)  07/04/20 139 lb (63 kg)    Body mass index is 25.51 kg/m.  Performance status (ECOG): 1 - Symptomatic but completely ambulatory  PHYSICAL EXAM:  Physical Exam Vitals and nursing note reviewed.  Constitutional:      General: She is not in acute distress.    Appearance: Normal appearance.  HENT:     Head: Normocephalic and atraumatic.     Mouth/Throat:     Mouth: Mucous membranes are  moist.     Pharynx: Oropharynx is clear. No oropharyngeal exudate, posterior oropharyngeal erythema or uvula swelling (Bifid uvula).  Eyes:     General: No scleral icterus.    Extraocular Movements: Extraocular movements intact.     Conjunctiva/sclera: Conjunctivae normal.     Pupils: Pupils are equal, round, and reactive to light.  Cardiovascular:     Rate and Rhythm: Normal rate and regular rhythm.     Heart sounds: Normal heart sounds. No murmur heard.    No friction rub. No gallop.  Pulmonary:     Effort: Pulmonary effort is normal.     Breath sounds: Normal breath sounds. No wheezing, rhonchi or rales.  Abdominal:     General: There is no distension.     Palpations: Abdomen is soft. There is no hepatomegaly, splenomegaly or mass.     Tenderness: There is no abdominal tenderness.  Musculoskeletal:        General: Normal range of motion.     Cervical back: Normal range of motion and neck supple. No tenderness.     Right lower leg: No edema.     Left lower leg: No edema.  Lymphadenopathy:     Cervical: No cervical adenopathy.  Upper Body:     Right upper body: No supraclavicular or axillary adenopathy.     Left upper body: No supraclavicular or axillary adenopathy.     Lower Body: No right inguinal adenopathy. No left inguinal adenopathy.  Skin:    General: Skin is warm and dry.     Coloration: Skin is not jaundiced.     Findings: No rash.  Neurological:     Mental Status: She is alert and oriented to person, place, and time.     Cranial Nerves: No cranial nerve deficit.  Psychiatric:        Mood and Affect: Mood normal.        Behavior: Behavior normal.        Thought Content: Thought content normal.     LABS:      Latest Ref Rng & Units 05/08/2023    8:12 AM 12/22/2009    6:58 AM 12/22/2009    6:48 AM  CBC  WBC 4.0 - 10.5 K/uL 3.7   6.5   Hemoglobin 12.0 - 15.0 g/dL 40.9  81.1  91.4   Hematocrit 36.0 - 46.0 % 41.1  45.0  43.2   Platelets 150 - 400 K/uL 291    285     Latest Reference Range & Units 05/08/23 08:12  Neutrophils % 59  Lymphocytes % 29  Monocytes Relative % 9  Eosinophil % 2  Basophil % 1  Immature Granulocytes % 0  NEUT# 1.7 - 7.7 K/uL 2.2  Lymphs Abs 0.7 - 4.0 K/uL 1.1  Monocyte # 0.1 - 1.0 K/uL 0.4  Eosinophils Absolute 0.0 - 0.5 K/uL 0.1  Basophils Absolute 0.0 - 0.1 K/uL 0.0  Abs Immature Granulocytes 0.00 - 0.07 K/uL 0.00    05/08/23 08:12  RBC Morphology MORPHOLOGY UNREMARKABLE  WBC Morphology MORPHOLOGY UNREMARKABLE  Plt Morphology Normal platelet morphology       Latest Ref Rng & Units 07/22/2020   11:50 AM 12/22/2009    6:58 AM  CMP  Glucose 65 - 99 mg/dL 87  94   BUN 6 - 20 mg/dL 11  9   Creatinine 7.82 - 1.00 mg/dL 9.56  0.7   Sodium 213 - 144 mmol/L 140  141   Potassium 3.5 - 5.2 mmol/L 4.3  3.9   Chloride 96 - 106 mmol/L 102  108   CO2 20 - 29 mmol/L 26    Calcium  8.7 - 10.2 mg/dL 9.1       STUDIES:  No results found.    HISTORY:   Past Medical History:  Diagnosis Date   Amenorrhea 05/22/2017   Anxiety    Cyst of right ovary 12/21/2019   Depression    Fatigue    Herpesviral vesicular dermatitis    Hypothyroid 06/03/2015   Hypothyroidism    Insomnia    Pulmonary nodules    Thyroid cancer (HCC) 07/21/2015   Vitamin D deficiency 07/21/2015    Past Surgical History:  Procedure Laterality Date   BREAST BIOPSY Left 10/14/2020   cauterazation of bilateral leg veins     CHOLECYSTECTOMY     CYSTECTOMY     cyst removed from ovary   gonadal embolisation      vein / for pelvic congestion syndrome   THYROIDECTOMY     TONSILLECTOMY     TUBAL LIGATION      Family History  Problem Relation Age of Onset   Diabetes Mother    COPD Mother    Hypertension Mother    Breast  cancer Mother    Lung cancer Mother    Stroke Father    Hypertension Father    Breast cancer Sister    Heart disease Sister        Enlarged heart   Breast cancer Maternal Grandmother    Lung cancer Maternal  Grandfather    Heart Problems Paternal Grandfather     Social History:  reports that she quit smoking about 3 years ago. Her smoking use included cigarettes. She has never used smokeless tobacco. She reports that she does not drink alcohol and does not use drugs.The patient is alone today.  Allergies: No Known Allergies  Current Medications: Current Outpatient Medications  Medication Sig Dispense Refill   acetaminophen (TYLENOL) 500 MG tablet Take 500 mg by mouth every 6 (six) hours as needed.     ibuprofen (ADVIL) 200 MG tablet Take 200 mg by mouth every 6 (six) hours as needed. 2 tabs     Melatonin 10 MG CAPS Take 1 tablet by mouth every evening.     ondansetron (ZOFRAN-ODT) 4 MG disintegrating tablet Take 4 mg by mouth every 8 (eight) hours as needed.     TIROSINT 175 MCG CAPS Take 175 mcg by mouth daily.     ALPRAZolam (XANAX) 0.5 MG tablet Take 0.5 mg by mouth at bedtime as needed for anxiety.     aspirin  EC 81 MG tablet Take 1 tablet (81 mg total) by mouth daily. Swallow whole. (Patient not taking: Reported on 05/08/2023) 90 tablet 3   ivabradine  (CORLANOR) 5 MG TABS tablet Take 10 mg (two tablets) 2 hours before CT scan. (Patient not taking: Reported on 05/08/2023) 2 tablet 0   lisdexamfetamine (VYVANSE) 60 MG capsule Take 60 mg by mouth every morning.     nitroGLYCERIN  (NITROSTAT ) 0.4 MG SL tablet Place 1 tablet (0.4 mg total) under the tongue every 5 (five) minutes as needed for chest pain. 45 tablet 3   traZODone (DESYREL) 100 MG tablet Take 100 mg by mouth at bedtime.     vortioxetine HBr (TRINTELLIX) 20 MG TABS tablet Take 20 mg by mouth daily.     No current facility-administered medications for this visit.     ASSESSMENT & PLAN:   Assessment/Plan:  Geraldean Guillemette is a 43 y.o. female with mild leukopenia of uncertain etiology.  This may be a benign variant.  This may be related to rheumatologic disorder, especially given her symptoms of Raynaud's phenomenon.  As she had  hepatic steatosis on previous CT imaging, this may be due to liver disease.  She is not currently on any medications which are associated with leukopenia.  B12 and folate were normal.  I will evaluate for other causes of leukopenia.  Due to report of her sister's abnormal genetics and family history of malignancy, I will refer her to our genetic counselor for consideration of testing for hereditary cancer syndromes.  She will try to obtain her sisters genetic testing.  I encouraged her to follow-up with endocrinologist regarding her history of thyroid cancer.  I will plan to see her back in 2 weeks to review the results.  I discussed the assessment and plan with the patient.  The patient was provided an opportunity to ask questions and all were answered.  The patient agreed with the plan and demonstrated an understanding of the instructions.    Thank you for the referral.    90 minutes was spent in patient care.  This included time spent preparing to see the patient (  e.g., review of tests), obtaining and/or reviewing separately obtained history, counseling and educating the patient/family/caregiver, ordering medications, tests, or procedures; documenting clinical information in the electronic or other health record, independently interpreting results and communicating results to the patient/family/caregiver as well as coordination of care.      Alfonso Ike, PA-C   Physician Assistant [facility] [place] [phone]

## 2023-05-08 ENCOUNTER — Telehealth: Payer: Self-pay | Admitting: Hematology and Oncology

## 2023-05-08 ENCOUNTER — Other Ambulatory Visit: Payer: Self-pay | Admitting: Hematology and Oncology

## 2023-05-08 ENCOUNTER — Inpatient Hospital Stay

## 2023-05-08 ENCOUNTER — Inpatient Hospital Stay: Attending: Hematology and Oncology | Admitting: Hematology and Oncology

## 2023-05-08 ENCOUNTER — Encounter: Payer: Self-pay | Admitting: Hematology and Oncology

## 2023-05-08 VITALS — BP 123/75 | HR 100 | Temp 97.9°F | Resp 18 | Ht 64.0 in | Wt 148.6 lb

## 2023-05-08 DIAGNOSIS — K760 Fatty (change of) liver, not elsewhere classified: Secondary | ICD-10-CM | POA: Insufficient documentation

## 2023-05-08 DIAGNOSIS — R519 Headache, unspecified: Secondary | ICD-10-CM | POA: Insufficient documentation

## 2023-05-08 DIAGNOSIS — Z87891 Personal history of nicotine dependence: Secondary | ICD-10-CM | POA: Diagnosis not present

## 2023-05-08 DIAGNOSIS — R5383 Other fatigue: Secondary | ICD-10-CM | POA: Diagnosis not present

## 2023-05-08 DIAGNOSIS — R61 Generalized hyperhidrosis: Secondary | ICD-10-CM | POA: Diagnosis not present

## 2023-05-08 DIAGNOSIS — D72819 Decreased white blood cell count, unspecified: Secondary | ICD-10-CM

## 2023-05-08 DIAGNOSIS — Z85850 Personal history of malignant neoplasm of thyroid: Secondary | ICD-10-CM | POA: Insufficient documentation

## 2023-05-08 DIAGNOSIS — Z803 Family history of malignant neoplasm of breast: Secondary | ICD-10-CM

## 2023-05-08 LAB — CBC WITH DIFFERENTIAL (CANCER CENTER ONLY)
Abs Immature Granulocytes: 0 10*3/uL (ref 0.00–0.07)
Basophils Absolute: 0 10*3/uL (ref 0.0–0.1)
Basophils Relative: 1 %
Eosinophils Absolute: 0.1 10*3/uL (ref 0.0–0.5)
Eosinophils Relative: 2 %
HCT: 41.1 % (ref 36.0–46.0)
Hemoglobin: 13.9 g/dL (ref 12.0–15.0)
Immature Granulocytes: 0 %
Lymphocytes Relative: 29 %
Lymphs Abs: 1.1 10*3/uL (ref 0.7–4.0)
MCH: 30.5 pg (ref 26.0–34.0)
MCHC: 33.8 g/dL (ref 30.0–36.0)
MCV: 90.1 fL (ref 80.0–100.0)
Monocytes Absolute: 0.4 10*3/uL (ref 0.1–1.0)
Monocytes Relative: 9 %
Neutro Abs: 2.2 10*3/uL (ref 1.7–7.7)
Neutrophils Relative %: 59 %
Platelet Count: 291 10*3/uL (ref 150–400)
RBC: 4.56 MIL/uL (ref 3.87–5.11)
RDW: 12.9 % (ref 11.5–15.5)
WBC Count: 3.7 10*3/uL — ABNORMAL LOW (ref 4.0–10.5)
nRBC: 0 % (ref 0.0–0.2)
nRBC: 0 /100{WBCs}

## 2023-05-08 LAB — RETICULOCYTES
Immature Retic Fract: 7 % (ref 2.3–15.9)
RBC.: 4.51 MIL/uL (ref 3.87–5.11)
Retic Count, Absolute: 62.2 10*3/uL (ref 19.0–186.0)
Retic Ct Pct: 1.4 % (ref 0.4–3.1)

## 2023-05-08 LAB — TECHNOLOGIST SMEAR REVIEW: Plt Morphology: NORMAL

## 2023-05-08 LAB — C-REACTIVE PROTEIN: CRP: <0.5 mg/dL (ref <1.0)

## 2023-05-08 LAB — SEDIMENTATION RATE: Sed Rate: 0 mm/h (ref 0–22)

## 2023-05-08 NOTE — Telephone Encounter (Signed)
 Patient has been scheduled for follow-up visit per 04/3023 LOS.  Pt aware of scheduled appt details.

## 2023-05-09 ENCOUNTER — Inpatient Hospital Stay: Attending: Hematology and Oncology

## 2023-05-09 DIAGNOSIS — D72819 Decreased white blood cell count, unspecified: Secondary | ICD-10-CM | POA: Insufficient documentation

## 2023-05-09 LAB — ANA: Anti Nuclear Antibody (ANA): NEGATIVE

## 2023-05-09 LAB — RHEUMATOID FACTOR: Rheumatoid fact SerPl-aCnc: <10 [IU]/mL (ref <14.0)

## 2023-05-10 ENCOUNTER — Telehealth: Payer: Self-pay

## 2023-05-10 ENCOUNTER — Encounter: Payer: Self-pay | Admitting: Hematology and Oncology

## 2023-05-10 LAB — COPPER, SERUM: Copper: 85 ug/dL (ref 80–158)

## 2023-05-10 NOTE — Telephone Encounter (Signed)
-----   Message from Alfonso Ike sent at 05/10/2023  1:41 PM EDT ----- Please let her know her copper level was normal. Thanks

## 2023-05-10 NOTE — Telephone Encounter (Signed)
 Detailed message left for patient.

## 2023-05-20 NOTE — Progress Notes (Deleted)
  [facility]  [address] [place],  [place]  [address] [phone]  Clinic Day:  05/20/2023  Referring physician: Verdia Glad, NP   HISTORY OF PRESENT ILLNESS:  The patient is a 43 y.o. female with borderline leukopenia. Evaluation did not reveal any specific etiology.  Markers for infection, inflammation and collagen vascular disease were normal.  She is here today to review the results.  PHYSICAL EXAM:   Last menstrual period 01/09/2023. Wt Readings from Last 3 Encounters:  05/08/23 148 lb 9.6 oz (67.4 kg)  07/19/20 140 lb (63.5 kg)  07/04/20 139 lb (63 kg)   There is no height or weight on file to calculate BMI.  Performance status (ECOG): {CHL ONC [id number]  Physical Exam  LABS:      Latest Ref Rng & Units 05/08/2023    8:12 AM 12/22/2009    6:58 AM 12/22/2009    6:48 AM  CBC  WBC 4.0 - 10.5 K/uL 3.7   6.5   Hemoglobin 12.0 - 15.0 g/dL 21.3  08.6  57.8   Hematocrit 36.0 - 46.0 % 41.1  45.0  43.2   Platelets 150 - 400 K/uL 291   285       Latest Ref Rng & Units 07/22/2020   11:50 AM 12/22/2009    6:58 AM  CMP  Glucose 65 - 99 mg/dL 87  94   BUN 6 - 20 mg/dL 11  9   Creatinine 4.69 - 1.00 mg/dL 6.29  0.7   Sodium 528 - 144 mmol/L 140  141   Potassium 3.5 - 5.2 mmol/L 4.3  3.9   Chloride 96 - 106 mmol/L 102  108   CO2 20 - 29 mmol/L 26    Calcium  8.7 - 10.2 mg/dL 9.1       No results found for: "CEA1", "CEA" / No results found for: "CEA1", "CEA" No results found for: "PSA1" No results found for: "UXL244" No results found for: "CAN125"  No results found for: "TOTALPROTELP", "ALBUMINELP", "A1GS", "A2GS", "BETS", "BETA2SER", "GAMS", "MSPIKE", "SPEI" No results found for: "TIBC", "FERRITIN", "IRONPCTSAT" No results found for: "LDH"  No results found for: "AFPTUMOR", "TOTALPROTELP", "ALBUMINELP", "A1GS", "A2GS", "BETS", "BETA2SER", "GAMS", "MSPIKE", "SPEI", "LDH", "CEA1", "CEA", "PSA1", "IGASERUM", "IGGSERUM", "IGMSERUM",  "THGAB", "THYROGLB"  Review Flowsheet        No data to display           STUDIES:  No results found.    ASSESSMENT & PLAN:   Assessment/Plan:  43 y.o. female with ***  The patient understands all the plans discussed today and is in agreement with them.  She knows to contact our office if she develops concerns prior to her next appointment.     Alfonso Ike, PA-C   Physician Assistant [facility] [place] [phone]

## 2023-05-23 ENCOUNTER — Inpatient Hospital Stay: Admitting: Hematology and Oncology

## 2023-06-02 ENCOUNTER — Encounter: Payer: Self-pay | Admitting: Hematology and Oncology

## 2023-06-07 ENCOUNTER — Other Ambulatory Visit: Payer: Self-pay | Admitting: Hematology and Oncology

## 2023-06-07 DIAGNOSIS — D72819 Decreased white blood cell count, unspecified: Secondary | ICD-10-CM

## 2023-06-10 ENCOUNTER — Telehealth: Payer: Self-pay | Admitting: Hematology and Oncology

## 2023-06-10 NOTE — Telephone Encounter (Signed)
 Patient has been scheduled for follow-up visit. LVM notifying pt of appt details, provided my direct number to pt if appt changes need to be made.      Scheduling Message Entered by Vera Gip A on 06/07/2023 at 10:19 AM Priority: Routine <No visit type provided>  Department:  Provider:  Scheduling Notes:  Sorry, I thought she was doing virtual from here. Can we schedule labs and f/u with me end of July then for routine f/u? Thanks  ----- Message -----  From: Coralyn Derry  Sent: 06/07/2023   8:48 AM EDT  To: Alfonso Ike, PA-C    Hi,    Pt is scheduled that day to go to [facility] for Genetics.  ----- Message -----  From: Alfonso Ike, PA-C  Sent: 06/07/2023   8:33 AM EDT  To: Scheduling Message Pool    She is here for genetics and labs on 6/27. She is not scheduled for f/u, so I would like to see her that day too (her f/u was cancelled due to her sister being in the hospital-sister has since died). Unfortunately, I already have a 10:30 patient. Can we work this out? Thanks

## 2023-07-05 ENCOUNTER — Other Ambulatory Visit

## 2023-07-05 ENCOUNTER — Encounter: Admitting: Genetic Counselor

## 2023-08-06 ENCOUNTER — Inpatient Hospital Stay: Admitting: Hematology and Oncology

## 2023-08-06 ENCOUNTER — Inpatient Hospital Stay: Attending: Hematology and Oncology

## 2023-08-13 ENCOUNTER — Other Ambulatory Visit: Payer: Self-pay

## 2023-08-13 ENCOUNTER — Inpatient Hospital Stay: Attending: Hematology and Oncology

## 2023-08-13 ENCOUNTER — Inpatient Hospital Stay: Admitting: Hematology and Oncology

## 2023-08-13 ENCOUNTER — Encounter: Payer: Self-pay | Admitting: Hematology and Oncology

## 2023-08-13 ENCOUNTER — Telehealth: Payer: Self-pay | Admitting: Hematology and Oncology

## 2023-08-13 VITALS — BP 116/76 | HR 70 | Temp 98.5°F | Resp 14 | Ht 64.0 in | Wt 152.3 lb

## 2023-08-13 DIAGNOSIS — D72819 Decreased white blood cell count, unspecified: Secondary | ICD-10-CM

## 2023-08-13 DIAGNOSIS — R252 Cramp and spasm: Secondary | ICD-10-CM | POA: Insufficient documentation

## 2023-08-13 DIAGNOSIS — N951 Menopausal and female climacteric states: Secondary | ICD-10-CM | POA: Insufficient documentation

## 2023-08-13 DIAGNOSIS — R519 Headache, unspecified: Secondary | ICD-10-CM | POA: Diagnosis not present

## 2023-08-13 DIAGNOSIS — R110 Nausea: Secondary | ICD-10-CM

## 2023-08-13 LAB — CBC WITH DIFFERENTIAL (CANCER CENTER ONLY)
Abs Immature Granulocytes: 0.02 K/uL (ref 0.00–0.07)
Basophils Absolute: 0.1 K/uL (ref 0.0–0.1)
Basophils Relative: 1 %
Eosinophils Absolute: 0 K/uL (ref 0.0–0.5)
Eosinophils Relative: 1 %
HCT: 42.9 % (ref 36.0–46.0)
Hemoglobin: 14.4 g/dL (ref 12.0–15.0)
Immature Granulocytes: 0 %
Lymphocytes Relative: 27 %
Lymphs Abs: 1.8 K/uL (ref 0.7–4.0)
MCH: 30.7 pg (ref 26.0–34.0)
MCHC: 33.6 g/dL (ref 30.0–36.0)
MCV: 91.5 fL (ref 80.0–100.0)
Monocytes Absolute: 0.6 K/uL (ref 0.1–1.0)
Monocytes Relative: 9 %
Neutro Abs: 4.3 K/uL (ref 1.7–7.7)
Neutrophils Relative %: 62 %
Platelet Count: 283 K/uL (ref 150–400)
RBC: 4.69 MIL/uL (ref 3.87–5.11)
RDW: 12.6 % (ref 11.5–15.5)
WBC Count: 6.8 K/uL (ref 4.0–10.5)
nRBC: 0 % (ref 0.0–0.2)

## 2023-08-13 LAB — CMP (CANCER CENTER ONLY)
ALT: 25 U/L (ref 0–44)
AST: 22 U/L (ref 15–41)
Albumin: 4.6 g/dL (ref 3.5–5.0)
Alkaline Phosphatase: 52 U/L (ref 38–126)
Anion gap: 11 (ref 5–15)
BUN: 18 mg/dL (ref 6–20)
CO2: 26 mmol/L (ref 22–32)
Calcium: 10 mg/dL (ref 8.9–10.3)
Chloride: 103 mmol/L (ref 98–111)
Creatinine: 0.79 mg/dL (ref 0.44–1.00)
GFR, Estimated: >60 mL/min (ref >60)
Glucose, Bld: 78 mg/dL (ref 70–99)
Potassium: 4.2 mmol/L (ref 3.5–5.1)
Sodium: 140 mmol/L (ref 135–145)
Total Bilirubin: <0.2 mg/dL (ref 0.0–1.2)
Total Protein: 6.6 g/dL (ref 6.5–8.1)

## 2023-08-13 LAB — TECHNOLOGIST SMEAR REVIEW: Plt Morphology: NORMAL

## 2023-08-13 NOTE — Progress Notes (Unsigned)
 [facility]  [address] [place],  [place]  [address] [phone]  Clinic Day:  08/13/2023  Referring physician: Zachary Lamar BRAVO, NP   HISTORY OF PRESENT ILLNESS:  The patient is a 43 y.o. 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  Evaluation did not reveal any specific etiology.  She was given results over the phone, as she lost her sister to have a rare tumor.  She is here today for repeat clinical assessment.  She reports persistent fatigue.  She has intermittent nondrenching night sweats.  She denies fevers, chills or other signs of infection.  She continues to report persistent daily nausea with occasional vomiting for which she is taking ondansetron daily in the morning and at night as needed.  She reports daily headaches not relieved with [place].  She had an MRI head in May that was negative.  She also reports worsening muscle cramps.  She discussed this with her endocrinologist who she sees for history of thyroid cancer, who found her magnesium to be borderline low at 1.8.  The patient states she is on magnesium daily.  She denies additional episodes of syncope.  She states she is not having regular menses.  Due to her family history of malignancy, she is scheduled to see the genetic counselor next week for consideration of testing for hereditary cancer syndromes.  VITALS:   Blood pressure 116/76, pulse 70, temperature 98.5 F (36.9 C), temperature source Oral, resp. rate 14, height 5' 4 (1.626 m), weight 152 lb 4.8 oz (69.1 kg), SpO2 100%. Wt Readings from Last 3 Encounters:  08/13/23 152 lb 4.8 oz (69.1 kg)  05/08/23 148 lb 9.6 oz (67.4 kg)  07/19/20 140 lb (63.5 kg)   Body mass index is 26.14 kg/m.  Performance status (ECOG): 1 - Symptomatic but completely ambulatory  PHYSICAL EXAM:   Physical Exam Vitals and nursing note reviewed.  Constitutional:      General: She is not in acute distress.    Appearance:  Normal appearance.  HENT:     Head: Normocephalic and atraumatic.     Mouth/Throat:     Mouth: Mucous membranes are moist.     Pharynx: Oropharynx is clear. No oropharyngeal exudate or posterior oropharyngeal erythema.  Eyes:     General: No scleral icterus.    Extraocular Movements: Extraocular movements intact.     Conjunctiva/sclera: Conjunctivae normal.     Pupils: Pupils are equal, round, and reactive to light.  Cardiovascular:     Rate and Rhythm: Normal rate and regular rhythm.     Heart sounds: Normal heart sounds. No murmur heard.    No friction rub. No gallop.  Pulmonary:     Effort: Pulmonary effort is normal.     Breath sounds: Normal breath sounds. No wheezing, rhonchi or rales.  Abdominal:     General: There is no distension.     Palpations: Abdomen is soft. There is no hepatomegaly, splenomegaly or mass.     Tenderness: There is no abdominal tenderness.  Musculoskeletal:        General: Normal range of motion.     Cervical back: Normal range of motion and neck supple. No tenderness.     Right lower leg: No edema.     Left lower leg: No edema.  Lymphadenopathy:     Cervical: No cervical adenopathy.     Upper Body:     Right upper body: No supraclavicular or axillary adenopathy.  Left upper body: No supraclavicular or axillary adenopathy.     Lower Body: No right inguinal adenopathy. No left inguinal adenopathy.  Skin:    General: Skin is warm and dry.     Coloration: Skin is not jaundiced.     Findings: No rash.  Neurological:     Mental Status: She is alert and oriented to person, place, and time.     Cranial Nerves: No cranial nerve deficit.  Psychiatric:        Mood and Affect: Mood normal.        Behavior: Behavior normal.        Thought Content: Thought content normal.      LABS:      Latest Ref Rng & Units 08/13/2023    8:38 AM 05/08/2023    8:12 AM 12/22/2009    6:58 AM  CBC  WBC 4.0 - 10.5 K/uL 6.8  3.7    Hemoglobin 12.0 - 15.0 g/dL 85.5   86.0  84.6   Hematocrit 36.0 - 46.0 % 42.9  41.1  45.0   Platelets 150 - 400 K/uL 283  291      Latest Reference Range & Units 08/13/23 08:38  Neutrophils % 62  Lymphocytes % 27  Monocytes Relative % 9  Eosinophil % 1  Basophil % 1  Immature Granulocytes % 0  NEUT# 1.7 - 7.7 K/uL 4.3  Lymphs Abs 0.7 - 4.0 K/uL 1.8  Monocyte # 0.1 - 1.0 K/uL 0.6  Eosinophils Absolute 0.0 - 0.5 K/uL 0.0  Basophils Absolute 0.0 - 0.1 K/uL 0.1    08/13/23 08:38  RBC Morphology MORPHOLOGY UNREMARKABLE  WBC Morphology MORPHOLOGY UNREMARKABLE  Plt Morphology Normal platelet morphology       Latest Ref Rng & Units 08/13/2023    8:38 AM 07/22/2020   11:50 AM 12/22/2009    6:58 AM  CMP  Glucose 70 - 99 mg/dL 78  87  94   BUN 6 - 20 mg/dL 18  11  9    Creatinine 0.44 - 1.00 mg/dL 9.20  9.33  0.7   Sodium 135 - 145 mmol/L 140  140  141   Potassium 3.5 - 5.1 mmol/L 4.2  4.3  3.9   Chloride 98 - 111 mmol/L 103  102  108   CO2 22 - 32 mmol/L 26  26    Calcium  8.9 - 10.3 mg/dL 89.9  9.1    Total Protein 6.5 - 8.1 g/dL 6.6     Total Bilirubin 0.0 - 1.2 mg/dL <9.7     Alkaline Phos 38 - 126 U/L 52     AST 15 - 41 U/L 22     ALT 0 - 44 U/L 25       STUDIES:   No results found.    ASSESSMENT & PLAN:   Assessment/Plan:  43 y.o. female with intermittent leukopenia.  Her WBCs are currently normal.  There is nothing concerning per her peripheral smear today.  She has multiple symptoms of unclear etiology.  In regards to the chronic nausea, I will refer her to GI for further evaluation.  Her headaches could potentially be triggered by ondansetron.  The intermittent night sweats may be due to perimenopause given her change in menstrual cycles.  The muscle cramping could be related to mild hypomagnesemia or possibly dehydration.  She knows to push clear fluids and continue oral magnesium supplement. CMP is normal.  I will plan to see her back in 6 months for repeat  clinical assessment.  The patient understands  all the plans discussed today and is in agreement with them.  She knows to contact our office if she develops concerns prior to her next appointment.    Amber DELENA Foy, PA-C   Physician Assistant [facility] [place] [phone]

## 2023-08-13 NOTE — Telephone Encounter (Signed)
 Patient has been scheduled for follow-up visit per 08/09/23 LOS.  LVM notifying pt of appt details, provided my direct number to pt if appt changes need to be made.

## 2023-08-15 ENCOUNTER — Telehealth: Payer: Self-pay

## 2023-08-15 DIAGNOSIS — R110 Nausea: Secondary | ICD-10-CM | POA: Insufficient documentation

## 2023-08-15 NOTE — Telephone Encounter (Signed)
-----   Message from Andrez DELENA Foy sent at 08/15/2023  4:34 PM EDT ----- Please refer to Dr. Charlanne for chronic nausea. Thanks

## 2023-08-15 NOTE — Addendum Note (Signed)
 Addended by: ANNIS BRUNO CROME on: 08/15/2023 04:41 PM   Modules accepted: Orders

## 2023-08-15 NOTE — Telephone Encounter (Signed)
Internal referral sent.

## 2023-08-19 ENCOUNTER — Telehealth: Payer: Self-pay | Admitting: Genetic Counselor

## 2023-08-19 ENCOUNTER — Telehealth: Payer: Self-pay | Admitting: Hematology and Oncology

## 2023-08-19 NOTE — Telephone Encounter (Signed)
 Regarding Genetic Referral- patient was emailed on 08/15/2023 friendly reminder of appt 08/20/2023 10:00am- patient cancelled appt this morning with scheduling - there is no specific reasoning of cancelling. Thank you

## 2023-08-19 NOTE — Telephone Encounter (Signed)
 Canceled appointments per patients request via incoming call. Talked with the patient and she is aware of the changes made to her upcoming appointments.

## 2023-08-20 ENCOUNTER — Inpatient Hospital Stay: Admitting: Genetic Counselor

## 2023-08-20 ENCOUNTER — Inpatient Hospital Stay

## 2023-09-04 ENCOUNTER — Encounter: Payer: Self-pay | Admitting: Genetic Counselor

## 2023-10-21 ENCOUNTER — Encounter: Payer: Self-pay | Admitting: Cardiology

## 2023-11-15 ENCOUNTER — Encounter: Payer: Self-pay | Admitting: Internal Medicine

## 2024-02-10 ENCOUNTER — Other Ambulatory Visit: Payer: Self-pay | Admitting: Hematology and Oncology

## 2024-02-10 DIAGNOSIS — D72819 Decreased white blood cell count, unspecified: Secondary | ICD-10-CM

## 2024-02-12 NOTE — Progress Notes (Unsigned)
 " [facility]  [address] [place],  [place]  [address] [phone]  Clinic Day:  02/12/2024  Referring physician: Zachary Lamar BRAVO, NP   HISTORY OF PRESENT ILLNESS:  The patient is a 44 y.o. 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  Evaluation did not reveal any specific etiology.  She is here today for repeat clinical assessment.  Due to her family history of malignancy, I recommended she see the genetic counselor next week for consideration of testing for hereditary cancer syndromes.  She had an appointment, but canceled.  VITALS:   There were no vitals taken for this visit. Wt Readings from Last 3 Encounters:  08/13/23 152 lb 4.8 oz (69.1 kg)  05/08/23 148 lb 9.6 oz (67.4 kg)  07/19/20 140 lb (63.5 kg)   There is no height or weight on file to calculate BMI.  Performance status (ECOG): 1 - Symptomatic but completely ambulatory  PHYSICAL EXAM:   Physical Exam   LABS:      Latest Ref Rng & Units 08/13/2023    8:38 AM 05/08/2023    8:12 AM 12/22/2009    6:58 AM  CBC  WBC 4.0 - 10.5 K/uL 6.8  3.7    Hemoglobin 12.0 - 15.0 g/dL 85.5  86.0  84.6   Hematocrit 36.0 - 46.0 % 42.9  41.1  45.0   Platelets 150 - 400 K/uL 283  291      Latest Reference Range & Units 08/13/23 08:38  Neutrophils % 62  Lymphocytes % 27  Monocytes Relative % 9  Eosinophil % 1  Basophil % 1  Immature Granulocytes % 0  NEUT# 1.7 - 7.7 K/uL 4.3  Lymphs Abs 0.7 - 4.0 K/uL 1.8  Monocyte # 0.1 - 1.0 K/uL 0.6  Eosinophils Absolute 0.0 - 0.5 K/uL 0.0  Basophils Absolute 0.0 - 0.1 K/uL 0.1    08/13/23 08:38  RBC Morphology MORPHOLOGY UNREMARKABLE  WBC Morphology MORPHOLOGY UNREMARKABLE  Plt Morphology Normal platelet morphology       Latest Ref Rng & Units 08/13/2023    8:38 AM 07/22/2020   11:50 AM 12/22/2009    6:58 AM  CMP  Glucose 70 - 99 mg/dL 78  87  94   BUN 6 - 20 mg/dL 18  11  9    Creatinine 0.44 - 1.00 mg/dL 9.20  9.33   0.7   Sodium 135 - 145 mmol/L 140  140  141   Potassium 3.5 - 5.1 mmol/L 4.2  4.3  3.9   Chloride 98 - 111 mmol/L 103  102  108   CO2 22 - 32 mmol/L 26  26    Calcium  8.9 - 10.3 mg/dL 89.9  9.1    Total Protein 6.5 - 8.1 g/dL 6.6     Total Bilirubin 0.0 - 1.2 mg/dL <9.7     Alkaline Phos 38 - 126 U/L 52     AST 15 - 41 U/L 22     ALT 0 - 44 U/L 25       STUDIES:   No results found.    ASSESSMENT & PLAN:   Assessment/Plan:  44 y.o. female with intermittent leukopenia.  *** Her WBCs are currently normal.  There is nothing concerning per her peripheral smear today.    I will plan to see her back in 6 months for repeat clinical assessment.  The patient understands all the plans discussed today and is in agreement with them.  She  knows to contact our office if she develops concerns prior to her next appointment.    Andrez DELENA Foy, PA-C   Physician Assistant [facility] [phone]    "

## 2024-02-13 ENCOUNTER — Inpatient Hospital Stay: Admitting: Hematology and Oncology

## 2024-02-13 ENCOUNTER — Inpatient Hospital Stay
